# Patient Record
Sex: Male | Born: 1995 | Race: Black or African American | Hispanic: No | Marital: Single | Smoking: Never smoker
Health system: Southern US, Community
[De-identification: ages and names within clinical notes are randomized; demographics above are authoritative.]

---
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
## 2016-10-26 ENCOUNTER — Emergency Department (HOSPITAL_COMMUNITY): Payer: BLUE CROSS/BLUE SHIELD

## 2016-10-26 ENCOUNTER — Encounter (HOSPITAL_COMMUNITY): Payer: Self-pay | Admitting: *Deleted

## 2016-10-26 ENCOUNTER — Emergency Department (HOSPITAL_COMMUNITY)
Admission: EM | Admit: 2016-10-26 | Discharge: 2016-10-26 | Disposition: A | Discharge status: Home / Self Care | Payer: BLUE CROSS/BLUE SHIELD | Attending: Emergency Medicine | Admitting: Emergency Medicine

## 2016-10-26 DIAGNOSIS — J069 Acute upper respiratory infection, unspecified: Secondary | ICD-10-CM | POA: Diagnosis not present

## 2016-10-26 DIAGNOSIS — Z79899 Other long term (current) drug therapy: Secondary | ICD-10-CM | POA: Diagnosis not present

## 2016-10-26 DIAGNOSIS — R05 Cough: Secondary | ICD-10-CM | POA: Diagnosis present

## 2016-10-26 MED ORDER — IPRATROPIUM-ALBUTEROL 0.5-2.5 (3) MG/3ML IN SOLN
3.0000 mL | Freq: Once | RESPIRATORY_TRACT | Status: AC
  Administered 2016-10-26: 3 mL via RESPIRATORY_TRACT
  Filled 2016-10-26: qty 3

## 2016-10-26 MED ORDER — BENZONATATE 100 MG PO CAPS: 100.0000 mg | ORAL_CAPSULE | Freq: Three times a day (TID) | ORAL | 0 refills | Status: DC

## 2016-10-26 MED ORDER — PREDNISONE 10 MG (21) PO TBPK: ORAL_TABLET | ORAL | 0 refills | Status: DC

## 2016-10-26 NOTE — ED Triage Notes (Signed)
To ED for eval of cough and congestion. [facility] not having a radiologist today. States symptoms started a couple of days ago. Productive cough - yellow with streaks of blood per pt. Appears in nad. Unknown fevers at home.

## 2016-10-26 NOTE — Discharge Instructions (Addendum)
Your chest xray today was negative for acute abnormalities such as pneumonia. Your symptoms are consistent with a viral illness. Viruses do not require antibiotics. Treatment is symptomatic care and it is important to note that these symptoms may last for 7-14 days.   Hydration: Symptoms will be intensified and complicated by dehydration. Dehydration can also extend the duration of symptoms. Drink plenty of fluids and get plenty of rest. You should be drinking at least half a liter of water an hour to stay hydrated. Electrolyte drinks are also encouraged. You should be drinking enough fluids to make your urine light yellow, almost clear. If this is not the case, you are not drinking enough water. Please note that some of the treatments indicated below will not be effective if you are not adequately hydrated. Pain or fever: Ibuprofen, Naproxen, or Tylenol for pain or fever.  Cough: Use the Tessalon for cough.  Congestion: Plain Mucinex may help relieve congestion. Saline sinus rinses and saline nasal sprays may also help relieve congestion.  Sore throat: Warm liquids or Chloraseptic spray may help soothe a sore throat. Gargle twice a day with a salt water solution made from a half teaspoon of salt in a cup of warm water.  Follow up: Follow up with a primary care provider, as needed, for any future management of this issue.

## 2016-10-26 NOTE — ED Provider Notes (Signed)
[facility]-EMERGENCY DEPT Provider Note   CSN: [id number] Arrival date & time: 10/26/16  1324  By signing my name below, I, Majel Homer, attest that this documentation has been prepared under the direction and in the presence of Shawn Joy, PA-C . Electronically Signed: Majel Homer, Scribe. 10/26/2016. 10:26 AM.  History   Chief Complaint Chief Complaint  Patient presents with  . Cough   The history is provided by the patient. No language interpreter was used.   HPI Comments: Austin Estes is a 21 y.o. male who presents to the Emergency Department complaining of gradually worsening, cough productive of yellow sputum that began ~2 days ago. Reports possible small streaks of blood in the sputum today. Pt reports associated sore throat, nasal congestion, and wheezing. He notes he visited Urgent Care this morning but was advised to visit the ED since they did not currently have xray capabilities. He denies fever/chills, N/V/D, chest pain, shortness of breath, or any other complaints.   History reviewed. No pertinent past medical history.  There are no active problems to display for this patient.  History reviewed. No pertinent surgical history.  Home Medications    Prior to Admission medications   Medication Sig Start Date End Date Taking? Authorizing Provider  benzonatate (TESSALON) 100 MG capsule Take 1 capsule (100 mg total) by mouth every 8 (eight) hours. 10/26/16   Shawn C Joy, PA-C  predniSONE (STERAPRED UNI-PAK 21 TAB) 10 MG (21) TBPK tablet Take 6 tabs day 1, 5 tabs day 2, 4 tabs day 3, 3 tabs day 4, 2 tabs day 5, and 1 tab on day 6. 10/26/16   Anselm Pancoast, PA-C    Family History History reviewed. No pertinent family history.  Social History Social History  Substance Use Topics  . Smoking status: Never Smoker  . Smokeless tobacco: Never Used  . Alcohol use No   Allergies   Penicillins  Review of Systems Review of Systems  Constitutional: Negative for chills and fever.  HENT:  Positive for congestion and sore throat. Negative for trouble swallowing and voice change.   Respiratory: Positive for cough. Negative for shortness of breath.   Cardiovascular: Negative for chest pain.  Gastrointestinal: Negative for abdominal pain, diarrhea and vomiting.  All other systems reviewed and are negative.  Physical Exam Updated Vital Signs BP 129/89 (BP Location: Left Arm)   Pulse 69   Temp 98.4 F (36.9 C) (Oral)   SpO2 98%   Physical Exam  Constitutional: He appears well-developed and well-nourished. No distress.  HENT:  Head: Normocephalic and atraumatic.  Mouth/Throat: Oropharynx is clear and moist.  Eyes: Conjunctivae are normal.  Neck: Normal range of motion. Neck supple.  Cardiovascular: Normal rate, regular rhythm, normal heart sounds and intact distal pulses.   Pulmonary/Chest: Effort normal. No respiratory distress. He has wheezes.  Global expiratory wheezes without focal concentration. No increased work of breathing. Patient speaks in full sentences without difficulty.  Abdominal: Soft. There is no tenderness. There is no guarding.  Musculoskeletal: He exhibits no edema.  Lymphadenopathy:    He has no cervical adenopathy.  Neurological: He is alert.  Skin: Skin is warm and dry. He is not diaphoretic.  Psychiatric: He has a normal mood and affect. His behavior is normal.  Nursing note and vitals reviewed.  ED Treatments / Results  DIAGNOSTIC STUDIES:  Oxygen Saturation is 98% on RA, normal by my interpretation.    COORDINATION OF CARE:  10:25 AM Discussed treatment plan with pt at bedside  which includes CXR and symptomatic treatment and pt agreed to plan.   Dg Chest 2 View  Result Date: 10/26/2016 CLINICAL DATA:  Cough, congestion and chest pressure for 2-3 days. EXAM: CHEST  2 VIEW COMPARISON:  None. FINDINGS: Lungs are clear. Heart size is normal. No pneumothorax or pleural fluid. S shaped thoracolumbar scoliosis is noted. IMPRESSION: No acute  disease. Scoliosis. Electronically Signed   By: Drusilla Kannerhomas  Dalessio M.D.   On: 10/26/2016 11:25    Procedures Procedures (including critical care time)  Medications Ordered in ED Medications  ipratropium-albuterol (DUONEB) 0.5-2.5 (3) MG/3ML nebulizer solution 3 mL (3 mLs Nebulization Given 10/26/16 1042)    Initial Impression / Assessment and Plan / ED Course  I have reviewed the triage vital signs and the nursing notes.  Pertinent labs & imaging results that were available during my care of the patient were reviewed by me and considered in my medical decision making (see chart for details).     Patient presents with symptoms consistent with URI versus influenza. No acute pulmonary abnormality on x-ray. Patient is nontoxic appearing, afebrile, not tachycardic, not tachypneic, not hypotensive, maintains SPO2 of 98-100% on room air, and is in no apparent distress. Patient has no signs of sepsis or other serious or life-threatening condition. Wheezing resolved with a single DuoNeb treatment. Patient was notified of the abnormal findings on x-ray indicating possible scoliosis. PCP follow-up recommended. Resources given. Further home care and return precautions discussed. Patient voices understanding of all instructions and is comfortable with discharge.   Vitals:   10/26/16 0947 10/26/16 1138 10/26/16 1140  BP: 129/89 121/58   Pulse: 69 64   Resp:  18 16  Temp: 98.4 F (36.9 C)  98 F (36.7 C)  TempSrc: Oral  Oral  SpO2: 98% 100%      I personally performed the services described in this documentation, which was scribed in my presence. The recorded information has been reviewed and is accurate.  Final Clinical Impressions(s) / ED Diagnoses   Final diagnoses:  Upper respiratory tract infection, unspecified type    New Prescriptions Discharge Medication List as of 10/26/2016 11:30 AM    START taking these medications   Details  benzonatate (TESSALON) 100 MG capsule Take 1 capsule  (100 mg total) by mouth every 8 (eight) hours., Starting Fri 10/26/2016, Print    predniSONE (STERAPRED UNI-PAK 21 TAB) 10 MG (21) TBPK tablet Take 6 tabs day 1, 5 tabs day 2, 4 tabs day 3, 3 tabs day 4, 2 tabs day 5, and 1 tab on day 6., Print         Anselm PancoastShawn C Joy, PA-C 10/28/16 1143    Shaune Pollackameron Isaacs, MD 10/28/16 [id number]

## 2016-10-26 NOTE — ED Notes (Signed)
Pt to xray

## 2018-08-16 IMAGING — DX DG CHEST 2V
2 series · 2 of 2 positions shown · non-contrast
Comparison: None.

CLINICAL DATA: Cough, congestion and chest pressure for 2-3 days.

EXAM:
CHEST  2 VIEW

[chest pa]
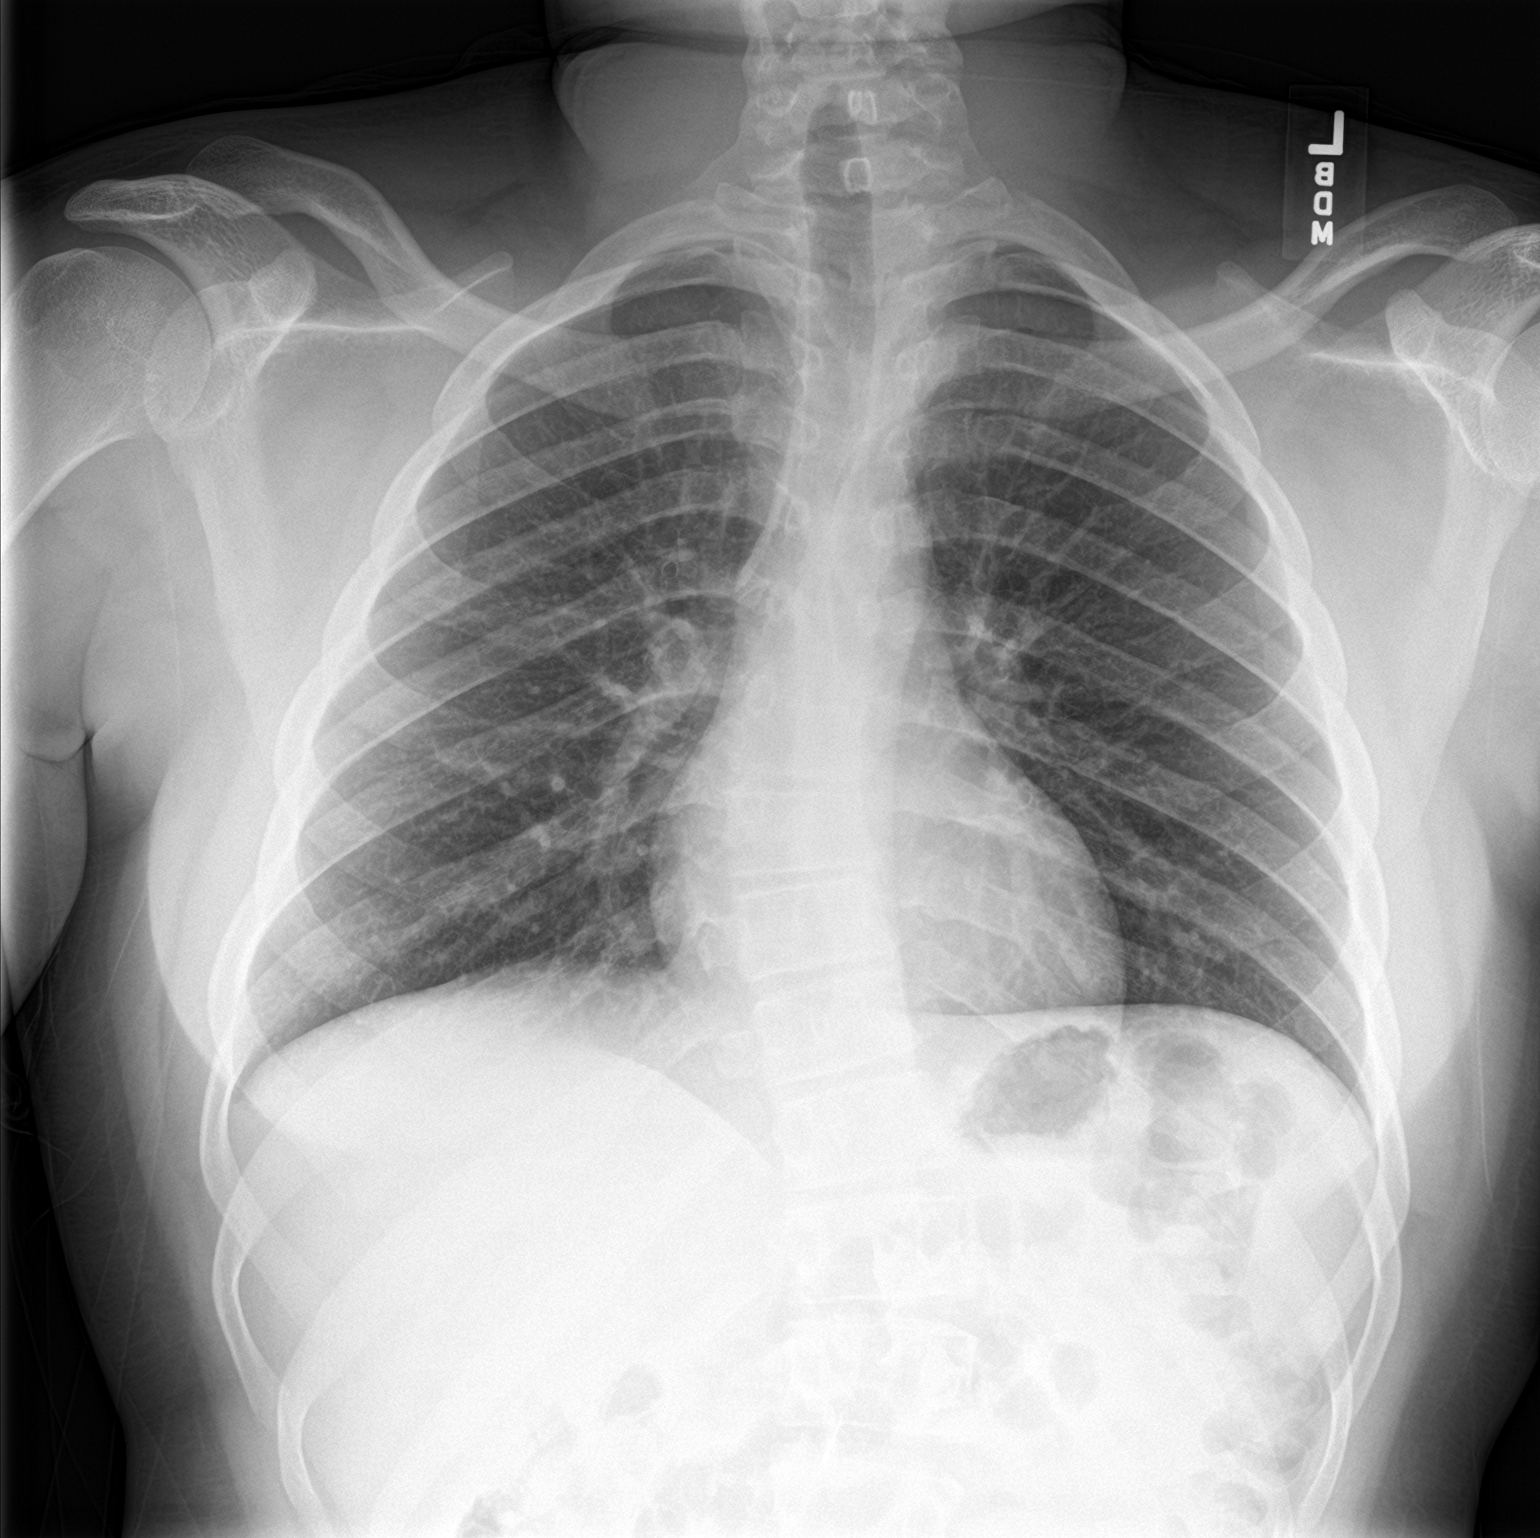

[chest lat]
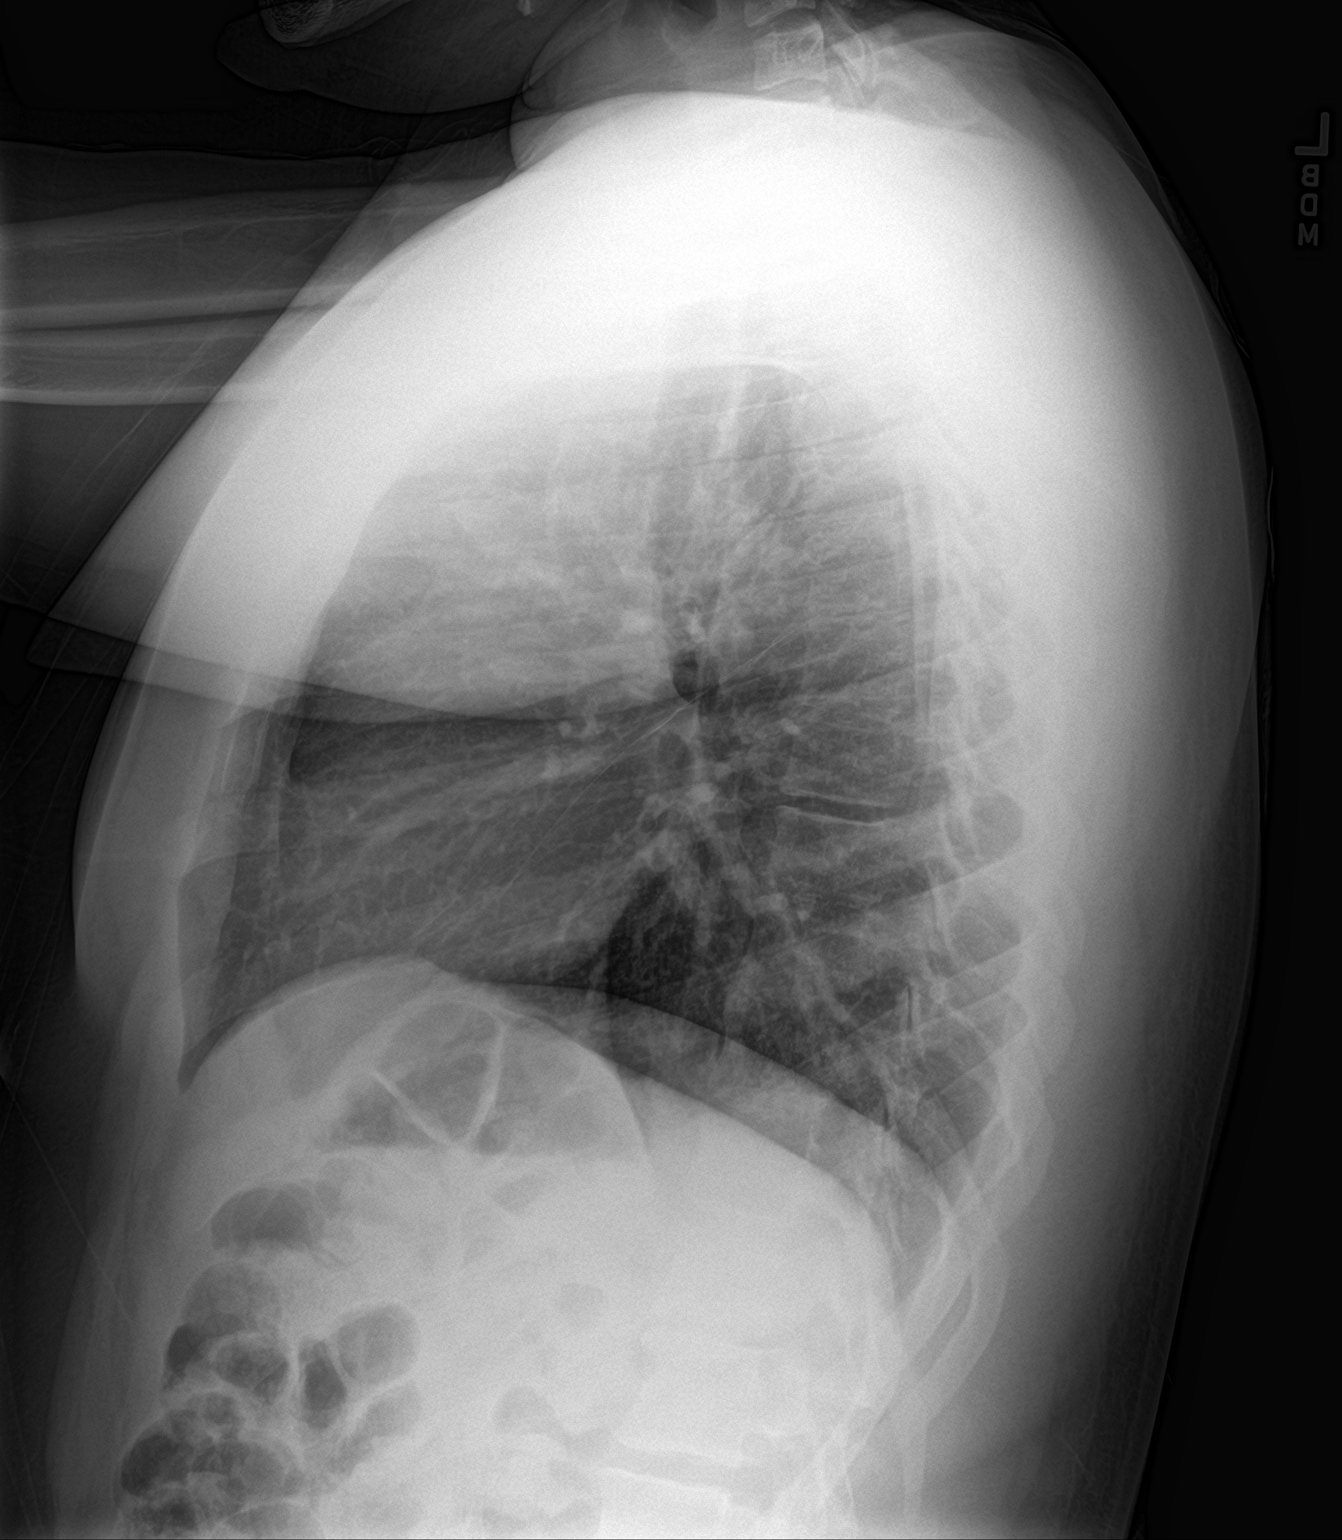

[2 of 2 positions shown; findings below may reference images not displayed]

FINDINGS: Lungs are clear. Heart size is normal. No pneumothorax or pleural
fluid. S shaped thoracolumbar scoliosis is noted.
IMPRESSION: No acute disease.

Scoliosis.

## 2019-08-13 ENCOUNTER — Other Ambulatory Visit: Payer: Self-pay

## 2019-08-13 DIAGNOSIS — Z20822 Contact with and (suspected) exposure to covid-19: Secondary | ICD-10-CM

## 2019-08-14 LAB — NOVEL CORONAVIRUS, NAA: SARS-CoV-2, NAA: NOT DETECTED

## 2019-08-18 ENCOUNTER — Other Ambulatory Visit: Payer: BC Managed Care – PPO

## 2019-08-18 ENCOUNTER — Ambulatory Visit: Payer: BC Managed Care – PPO | Attending: Internal Medicine

## 2019-08-18 DIAGNOSIS — Z20822 Contact with and (suspected) exposure to covid-19: Secondary | ICD-10-CM

## 2019-08-19 LAB — NOVEL CORONAVIRUS, NAA: SARS-CoV-2, NAA: NOT DETECTED

## 2020-09-06 ENCOUNTER — Other Ambulatory Visit: Payer: Self-pay

## 2020-09-06 ENCOUNTER — Emergency Department (HOSPITAL_COMMUNITY)
Admission: EM | Admit: 2020-09-06 | Discharge: 2020-09-06 | Disposition: A | Discharge status: Home / Self Care | Payer: BLUE CROSS/BLUE SHIELD | Attending: Emergency Medicine | Admitting: Emergency Medicine

## 2020-09-06 DIAGNOSIS — U071 COVID-19: Secondary | ICD-10-CM | POA: Diagnosis not present

## 2020-09-06 DIAGNOSIS — R109 Unspecified abdominal pain: Secondary | ICD-10-CM | POA: Diagnosis present

## 2020-09-06 LAB — URINALYSIS, ROUTINE W REFLEX MICROSCOPIC
Bilirubin Urine: NEGATIVE
Glucose, UA: NEGATIVE mg/dL
Hgb urine dipstick: NEGATIVE
Ketones, ur: NEGATIVE mg/dL
Leukocytes,Ua: NEGATIVE
Nitrite: NEGATIVE
Protein, ur: NEGATIVE mg/dL
Specific Gravity, Urine: 1.02 (ref 1.005–1.030)
pH: 5 (ref 5.0–8.0)

## 2020-09-06 LAB — COMPREHENSIVE METABOLIC PANEL
ALT: 20 U/L (ref 0–44)
AST: 22 U/L (ref 15–41)
Albumin: 4.4 g/dL (ref 3.5–5.0)
Alkaline Phosphatase: 47 U/L (ref 38–126)
Anion gap: 12 (ref 5–15)
BUN: 14 mg/dL (ref 6–20)
CO2: 25 mmol/L (ref 22–32)
Calcium: 9.7 mg/dL (ref 8.9–10.3)
Chloride: 102 mmol/L (ref 98–111)
Creatinine, Ser: 0.98 mg/dL (ref 0.61–1.24)
GFR, Estimated: >60 mL/min (ref >60)
Glucose, Bld: 144 mg/dL — ABNORMAL HIGH (ref 70–99)
Potassium: 3.8 mmol/L (ref 3.5–5.1)
Sodium: 139 mmol/L (ref 135–145)
Total Bilirubin: 0.5 mg/dL (ref 0.3–1.2)
Total Protein: 8.1 g/dL (ref 6.5–8.1)

## 2020-09-06 LAB — CBC
HCT: 45.8 % (ref 39.0–52.0)
Hemoglobin: 15.3 g/dL (ref 13.0–17.0)
MCH: 29.5 pg (ref 26.0–34.0)
MCHC: 33.4 g/dL (ref 30.0–36.0)
MCV: 88.4 fL (ref 80.0–100.0)
Platelets: 279 10*3/uL (ref 150–400)
RBC: 5.18 MIL/uL (ref 4.22–5.81)
RDW: 12.9 % (ref 11.5–15.5)
WBC: 9.6 10*3/uL (ref 4.0–10.5)
nRBC: 0 % (ref 0.0–0.2)

## 2020-09-06 LAB — LIPASE, BLOOD: Lipase: 19 U/L (ref 11–51)

## 2020-09-06 MED ORDER — ONDANSETRON 4 MG PO TBDP: 4.0000 mg | ORAL_TABLET | Freq: Three times a day (TID) | ORAL | 0 refills | Status: DC | PRN

## 2020-09-06 NOTE — ED Provider Notes (Signed)
[facility] EMERGENCY DEPARTMENT Provider Note   CSN: [id number] Arrival date & time: 09/06/20  0720     History Chief Complaint  Patient presents with  . Abdominal Pain    Austin Estes is a 25 y.o. male.  Presented to ER with concern for episode of vomiting as well as diarrhea.  Patient reports about a week ago he had some mild symptoms of COVID-19 and tested positive.  At the time he noted fatigue, body aches, nonproductive cough.  Chills.  His symptoms had completely resolved and he had actually returned to work.  However yesterday afternoon he had an episode of feeling nauseous and an episode of vomiting, nonbloody nonbilious.  Since then he has had multiple episodes of loose stools, estimates around 7.  No blood or black stools.  HPI     No past medical history on file.  There are no problems to display for this patient.   No past surgical history on file.     No family history on file.  Social History   Tobacco Use  . Smoking status: Never Smoker  . Smokeless tobacco: Never Used  Substance Use Topics  . Alcohol use: No  . Drug use: No    Home Medications Prior to Admission medications   Medication Sig Start Date End Date Taking? Authorizing Provider  ondansetron (ZOFRAN ODT) 4 MG disintegrating tablet Take 1 tablet (4 mg total) by mouth every 8 (eight) hours as needed for nausea or vomiting. 09/06/20  Yes Verdell Kincannon, Quitman Livings, MD  benzonatate (TESSALON) 100 MG capsule Take 1 capsule (100 mg total) by mouth every 8 (eight) hours. 10/26/16   Joy, Shawn C, PA-C  predniSONE (STERAPRED UNI-PAK 21 TAB) 10 MG (21) TBPK tablet Take 6 tabs day 1, 5 tabs day 2, 4 tabs day 3, 3 tabs day 4, 2 tabs day 5, and 1 tab on day 6. 10/26/16   Joy, Shawn C, PA-C    Allergies    Penicillins  Review of Systems   Review of Systems  Constitutional: Positive for chills, fatigue and fever.  HENT: Negative for ear pain and sore throat.   Eyes: Negative for pain and  visual disturbance.  Respiratory: Negative for cough and shortness of breath.   Cardiovascular: Negative for chest pain and palpitations.  Gastrointestinal: Positive for diarrhea, nausea and vomiting. Negative for abdominal pain.  Genitourinary: Negative for dysuria and hematuria.  Musculoskeletal: Negative for arthralgias and back pain.  Skin: Negative for color change and rash.  Neurological: Negative for seizures and syncope.  All other systems reviewed and are negative.   Physical Exam Updated Vital Signs BP 131/78 (BP Location: Right Arm)   Pulse 72   Temp 98.6 F (37 C) (Oral)   Resp 19   Ht 5\' 2"  (1.575 m)   Wt 111.1 kg   SpO2 100%   BMI 44.81 kg/m   Physical Exam Vitals and nursing note reviewed.  Constitutional:      Appearance: He is well-developed and well-nourished.  HENT:     Head: Normocephalic and atraumatic.  Eyes:     Conjunctiva/sclera: Conjunctivae normal.  Cardiovascular:     Rate and Rhythm: Normal rate and regular rhythm.     Heart sounds: No murmur heard.   Pulmonary:     Effort: Pulmonary effort is normal. No respiratory distress.     Breath sounds: Normal breath sounds.  Abdominal:     Palpations: Abdomen is soft.     Tenderness: There  is no abdominal tenderness.  Musculoskeletal:        General: No edema.     Cervical back: Neck supple.  Skin:    General: Skin is warm and dry.  Neurological:     General: No focal deficit present.     Mental Status: He is alert.  Psychiatric:        Mood and Affect: Mood and affect and mood normal.        Behavior: Behavior normal.     ED Results / Procedures / Treatments   Labs (all labs ordered are listed, but only abnormal results are displayed) Labs Reviewed  COMPREHENSIVE METABOLIC PANEL - Abnormal; Notable for the following components:      Result Value   Glucose, Bld 144 (*)    All other components within normal limits  LIPASE, BLOOD  CBC  URINALYSIS, ROUTINE W REFLEX MICROSCOPIC     EKG None  Radiology No results found.  Procedures Procedures (including critical care time)  Medications Ordered in ED Medications - No data to display  ED Course  I have reviewed the triage vital signs and the nursing notes.  Pertinent labs & imaging results that were available during my care of the patient were reviewed by me and considered in my medical decision making (see chart for details).    MDM Rules/Calculators/A&P                          25 year old male presents to ER with concern for diarrhea and nausea in the setting of recent COVID-19 infection.  On physical exam, patient noted to be remarkably well-appearing in no acute distress.  His abdominal exam was reassuring, no tenderness appreciated on careful palpation.  Doubt acute abdominal process.  Suspect symptoms related to recent COVID infection.  He is tolerating p.o. without difficulty, vitals are stable.  Believe he is appropriate for outpatient management.  Reviewed return precautions and discharged home with rx for zofran.     After the discussed management above, the patient was determined to be safe for discharge.  The patient was in agreement with this plan and all questions regarding their care were answered.  ED return precautions were discussed and the patient will return to the ED with any significant worsening of condition.    Final Clinical Impression(s) / ED Diagnoses Final diagnoses:  COVID-19    Rx / DC Orders ED Discharge Orders         Ordered    ondansetron (ZOFRAN ODT) 4 MG disintegrating tablet  Every 8 hours PRN        09/06/20 1146           Milagros Loll, MD 09/06/20 1311

## 2020-09-06 NOTE — ED Triage Notes (Signed)
Pt dx with covid on 1/2, completed his quarantine, and was feeling better this weekend but woke up this morning with abdominal pain, nausea, and cold sweats.

## 2020-09-06 NOTE — Discharge Instructions (Addendum)
FOLLOW UP WITH A PRIMARY CARE DOCTOR. RETURN FOR WORSENING SYMPTOMS SUCH AS VOMITING OR DIFFICULTY BREATHING.

## 2020-09-09 ENCOUNTER — Telehealth: Payer: Self-pay | Admitting: Nurse Practitioner

## 2020-09-09 NOTE — Telephone Encounter (Signed)
[facility] [phone]) called pt for HFU. Unable to leave msg for return call to schedule HFU appt, mailbox full.Marland Kitchen

## 2023-12-05 ENCOUNTER — Ambulatory Visit: Admitting: Family Medicine
